# Patient Record
Sex: Female | Born: 1960 | Race: Asian | Hispanic: No | Marital: Married | Smoking: Never smoker
Health system: Southern US, Community
[De-identification: ages and names within clinical notes are randomized; demographics above are authoritative.]

## PROBLEM LIST (undated history)

## (undated) DIAGNOSIS — E785 Hyperlipidemia, unspecified: Secondary | ICD-10-CM

## (undated) DIAGNOSIS — K635 Polyp of colon: Secondary | ICD-10-CM

## (undated) DIAGNOSIS — G43909 Migraine, unspecified, not intractable, without status migrainosus: Secondary | ICD-10-CM

## (undated) DIAGNOSIS — M1990 Unspecified osteoarthritis, unspecified site: Secondary | ICD-10-CM

## (undated) DIAGNOSIS — I10 Essential (primary) hypertension: Secondary | ICD-10-CM

## (undated) DIAGNOSIS — E039 Hypothyroidism, unspecified: Secondary | ICD-10-CM

## (undated) HISTORY — DX: Polyp of colon: K63.5

## (undated) HISTORY — DX: Hyperlipidemia, unspecified: E78.5

## (undated) HISTORY — DX: Unspecified osteoarthritis, unspecified site: M19.90

## (undated) HISTORY — DX: Essential (primary) hypertension: I10

## (undated) HISTORY — PX: WISDOM TOOTH EXTRACTION: SHX21

## (undated) HISTORY — DX: Migraine, unspecified, not intractable, without status migrainosus: G43.909

---

## 1999-11-29 ENCOUNTER — Inpatient Hospital Stay (HOSPITAL_COMMUNITY)
Admission: RE | Admit: 1999-11-29 | Discharge: 1999-12-04 | Payer: Self-pay | Admitting: Physical Medicine & Rehabilitation

## 2000-01-07 ENCOUNTER — Encounter: Admission: RE | Admit: 2000-01-07 | Discharge: 2000-03-14 | Payer: Self-pay | Admitting: Unknown Physician Specialty

## 2005-11-25 ENCOUNTER — Ambulatory Visit: Payer: Self-pay | Admitting: Obstetrics and Gynecology

## 2010-08-02 ENCOUNTER — Ambulatory Visit: Payer: Self-pay | Admitting: Obstetrics and Gynecology

## 2011-08-22 ENCOUNTER — Ambulatory Visit: Payer: Self-pay | Admitting: Obstetrics and Gynecology

## 2012-08-27 ENCOUNTER — Ambulatory Visit: Payer: Self-pay | Admitting: Obstetrics and Gynecology

## 2016-04-21 ENCOUNTER — Encounter: Payer: Self-pay | Admitting: Internal Medicine

## 2017-07-31 ENCOUNTER — Telehealth: Payer: Self-pay

## 2017-07-31 NOTE — Telephone Encounter (Signed)
Gastroenterology Pre-Procedure Review  Request Date:   Requesting Physician: Dr.    PATIENT REVIEW QUESTIONS: The patient responded to the following health history questions as indicated:    1. Are you having any GI issues? No  2. Do you have a personal history of Polyps? No  3. Do you have a family history of Colon Cancer or Polyps? No  4. Diabetes Mellitus? No  5. Joint replacements in the past 12 months? No  6. Major health problems in the past 3 months? No  7. Any artificial heart valves, MVP, or defibrillator? No     MEDICATIONS & ALLERGIES:    Patient reports the following regarding taking any anticoagulation/antiplatelet therapy:   Plavix, Coumadin, Eliquis, Xarelto, Lovenox, Pradaxa, Brilinta, or Effient? No  Aspirin? No   Patient confirms/reports the following medications:  Current Outpatient Medications  Medication Sig Dispense Refill  . ALPRAZolam (XANAX) 0.25 MG tablet Take 0.25 mg by mouth 2 (two) times daily as needed for anxiety.    Marland Kitchen atorvastatin (LIPITOR) 20 MG tablet TAKE 1 TABLET ONCE A DAY ORALLY  0  . hydrochlorothiazide (HYDRODIURIL) 25 MG tablet Take 25 mg by mouth daily.  1  . ibandronate (BONIVA) 150 MG tablet TAKE 1 TABLET ONCE A MONTH ORALLY  0  . levothyroxine (SYNTHROID, LEVOTHROID) 50 MCG tablet TAKE 1 TABLET ONCE A DAY (IN THE MORNING)  0   No current facility-administered medications for this visit.     Patient confirms/reports the following allergies:  No Known Allergies  No orders of the defined types were placed in this encounter.   AUTHORIZATION INFORMATION Primary Insurance: 1D#: Group #:  Secondary Insurance: 1D#: Group #:  SCHEDULE INFORMATION: Date: 09/04/17 Time: Location: [place]

## 2017-08-01 ENCOUNTER — Other Ambulatory Visit: Payer: Self-pay

## 2017-08-01 DIAGNOSIS — Z1211 Encounter for screening for malignant neoplasm of colon: Secondary | ICD-10-CM

## 2017-09-04 ENCOUNTER — Ambulatory Visit
Admission: RE | Admit: 2017-09-04 | Discharge: 2017-09-04 | Disposition: A | Discharge status: Home / Self Care | Payer: BLUE CROSS/BLUE SHIELD | Source: Ambulatory Visit | Attending: Gastroenterology | Admitting: Gastroenterology

## 2017-09-04 ENCOUNTER — Ambulatory Visit: Payer: BLUE CROSS/BLUE SHIELD | Admitting: Registered Nurse

## 2017-09-04 ENCOUNTER — Encounter
Admission: RE | Disposition: A | Discharge status: Home / Self Care | Payer: Self-pay | Source: Ambulatory Visit | Attending: Gastroenterology

## 2017-09-04 ENCOUNTER — Encounter: Payer: Self-pay | Admitting: Anesthesiology

## 2017-09-04 DIAGNOSIS — D122 Benign neoplasm of ascending colon: Secondary | ICD-10-CM

## 2017-09-04 DIAGNOSIS — Z79890 Hormone replacement therapy (postmenopausal): Secondary | ICD-10-CM | POA: Diagnosis not present

## 2017-09-04 DIAGNOSIS — E039 Hypothyroidism, unspecified: Secondary | ICD-10-CM | POA: Insufficient documentation

## 2017-09-04 DIAGNOSIS — D128 Benign neoplasm of rectum: Secondary | ICD-10-CM | POA: Diagnosis not present

## 2017-09-04 DIAGNOSIS — Z1211 Encounter for screening for malignant neoplasm of colon: Secondary | ICD-10-CM | POA: Insufficient documentation

## 2017-09-04 DIAGNOSIS — K621 Rectal polyp: Secondary | ICD-10-CM

## 2017-09-04 DIAGNOSIS — Z79899 Other long term (current) drug therapy: Secondary | ICD-10-CM | POA: Insufficient documentation

## 2017-09-04 HISTORY — PX: COLONOSCOPY WITH PROPOFOL: SHX5780

## 2017-09-04 HISTORY — DX: Hypothyroidism, unspecified: E03.9

## 2017-09-04 SURGERY — COLONOSCOPY WITH PROPOFOL
Anesthesia: General

## 2017-09-04 MED ORDER — PROPOFOL 10 MG/ML IV BOLUS
INTRAVENOUS | Status: DC | PRN
  Administered 2017-09-04: 30 mg via INTRAVENOUS
  Administered 2017-09-04: 70 mg via INTRAVENOUS
  Administered 2017-09-04: 20 mg via INTRAVENOUS

## 2017-09-04 MED ORDER — PROPOFOL 500 MG/50ML IV EMUL
INTRAVENOUS | Status: AC
  Filled 2017-09-04: qty 50

## 2017-09-04 MED ORDER — PROPOFOL 500 MG/50ML IV EMUL
INTRAVENOUS | Status: AC
  Filled 2017-09-04: qty 100

## 2017-09-04 MED ORDER — SODIUM CHLORIDE 0.9 % IV SOLN
INTRAVENOUS | Status: DC
  Administered 2017-09-04: 1000 mL via INTRAVENOUS

## 2017-09-04 MED ORDER — PROPOFOL 500 MG/50ML IV EMUL
INTRAVENOUS | Status: DC | PRN
  Administered 2017-09-04: 140 ug/kg/min via INTRAVENOUS

## 2017-09-04 NOTE — Anesthesia Preprocedure Evaluation (Signed)
Anesthesia Evaluation  Patient identified by MRN, date of birth, ID band Patient awake    Reviewed: Allergy & Precautions, H&P , NPO status , Patient's Chart, lab work & pertinent test results  History of Anesthesia Complications Negative for: history of anesthetic complications  Airway Mallampati: III  TM Distance: >3 FB Neck ROM: full    Dental  (+) Chipped   Pulmonary neg pulmonary ROS, neg shortness of breath,           Cardiovascular Exercise Tolerance: Good hypertension, (-) angina(-) Past MI and (-) DOE      Neuro/Psych PSYCHIATRIC DISORDERS negative neurological ROS     GI/Hepatic negative GI ROS, Neg liver ROS, neg GERD  ,  Endo/Other  Hypothyroidism   Renal/GU negative Renal ROS  negative genitourinary   Musculoskeletal   Abdominal   Peds  Hematology negative hematology ROS (+)   Anesthesia Other Findings Past Medical History: No date: Hypothyroidism  History reviewed. No pertinent surgical history.  BMI    Body Mass Index:  24.45 kg/m      Reproductive/Obstetrics negative OB ROS                             Anesthesia Physical Anesthesia Plan  ASA: III  Anesthesia Plan: General   Post-op Pain Management:    Induction: Intravenous  PONV Risk Score and Plan: Propofol infusion and TIVA  Airway Management Planned: Natural Airway and Nasal Cannula  Additional Equipment:   Intra-op Plan:   Post-operative Plan:   Informed Consent: I have reviewed the patients History and Physical, chart, labs and discussed the procedure including the risks, benefits and alternatives for the proposed anesthesia with the patient or authorized representative who has indicated his/her understanding and acceptance.   Dental Advisory Given  Plan Discussed with: Anesthesiologist, CRNA and Surgeon  Anesthesia Plan Comments: (Patient consented for risks of anesthesia including but not  limited to:  - adverse reactions to medications - risk of intubation if required - damage to teeth, lips or other oral mucosa - sore throat or hoarseness - Damage to heart, brain, lungs or loss of life  Patient voiced understanding.)        Anesthesia Quick Evaluation

## 2017-09-04 NOTE — Anesthesia Postprocedure Evaluation (Signed)
Anesthesia Post Note  Patient: Chelsea Lozano  Procedure(s) Performed: COLONOSCOPY WITH PROPOFOL (N/A )  Patient location during evaluation: Endoscopy Anesthesia Type: General Level of consciousness: awake and alert Pain management: pain level controlled Vital Signs Assessment: post-procedure vital signs reviewed and stable Respiratory status: spontaneous breathing, nonlabored ventilation, respiratory function stable and patient connected to nasal cannula oxygen Cardiovascular status: blood pressure returned to baseline and stable Postop Assessment: no apparent nausea or vomiting Anesthetic complications: no     Last Vitals:  Vitals:   09/04/17 1135 09/04/17 1136  BP:  (!) 86/47  Pulse:  73  Resp:  18  Temp: (!) 36.1 C (!) 36.1 C  SpO2:  100%    Last Pain:  Vitals:   09/04/17 1136  TempSrc: Tympanic  PainSc:                  Precious Haws Piscitello

## 2017-09-04 NOTE — Anesthesia Post-op Follow-up Note (Signed)
Anesthesia QCDR form completed.        

## 2017-09-04 NOTE — H&P (Signed)
     Jonathon Bellows, MD [address], [place], [place], [place], [address] [address], [place], [place], [place], [address] Phone: [phone]  Fax: [phone]  Primary Care Physician:  Casilda Carls, MD   Pre-Procedure History & Physical: HPI:  NOVALEE HORSFALL is a 57 y.o. female is here for an colonoscopy.   Past Medical History:  Diagnosis Date  . Hypothyroidism     History reviewed. No pertinent surgical history.  Prior to Admission medications   Medication Sig Start Date End Date Taking? Authorizing Provider  atorvastatin (LIPITOR) 20 MG tablet TAKE 1 TABLET ONCE A DAY ORALLY 06/12/17  Yes [provider]  hydrochlorothiazide (HYDRODIURIL) 25 MG tablet Take 25 mg by mouth daily. 05/07/17  Yes [provider]  ibandronate (BONIVA) 150 MG tablet TAKE 1 TABLET ONCE A MONTH ORALLY 06/27/17  Yes [provider]  levothyroxine (SYNTHROID, LEVOTHROID) 50 MCG tablet TAKE 1 TABLET ONCE A DAY (IN THE MORNING) 06/12/17  Yes [provider]  ALPRAZolam (XANAX) 0.25 MG tablet Take 0.25 mg by mouth 2 (two) times daily as needed for anxiety.    [provider]    Allergies as of 08/01/2017  . (No Known Allergies)    History reviewed. No pertinent family history.  Social History   Socioeconomic History  . Marital status: Married    Spouse name: Not on file  . Number of children: Not on file  . Years of education: Not on file  . Highest education level: Not on file  Social Needs  . Financial resource strain: Not on file  . Food insecurity - worry: Not on file  . Food insecurity - inability: Not on file  . Transportation needs - medical: Not on file  . Transportation needs - non-medical: Not on file  Occupational History  . Not on file  Tobacco Use  . Smoking status: Not on file  Substance and Sexual Activity  . Alcohol use: Not on file  . Drug use: Not on file  . Sexual activity: Not on file  Other Topics Concern  . Not  on file  Social History Narrative  . Not on file    Review of Systems: See HPI, otherwise negative ROS  Physical Exam: BP 122/84   Pulse 64   Temp (!) 97.2 F (36.2 C) (Tympanic)   Resp 16   Ht 5\' 3"  (1.6 m)   Wt 138 lb (62.6 kg)   SpO2 99%   BMI 24.45 kg/m  General:   Alert,  pleasant and cooperative in NAD Head:  Normocephalic and atraumatic. Neck:  Supple; no masses or thyromegaly. Lungs:  Clear throughout to auscultation, normal respiratory effort.    Heart:  +S1, +S2, Regular rate and rhythm, No edema. Abdomen:  Soft, nontender and nondistended. Normal bowel sounds, without guarding, and without rebound.   Neurologic:  Alert and  oriented x4;  grossly normal neurologically.  Impression/Plan: Sharin Mons is here for an colonoscopy to be performed for Screening colonoscopy average risk   Risks, benefits, limitations, and alternatives regarding  colonoscopy have been reviewed with the patient.  Questions have been answered.  All parties agreeable.   Jonathon Bellows, MD  09/04/2017, 11:08 AM

## 2017-09-04 NOTE — Op Note (Signed)
[facility] Gastroenterology Patient Name: Chelsea Lozano Procedure Date: 09/04/2017 11:09 AM MRN: [id number] Account #: [id number] Date of Birth: September 29, 1960 Admit Type: Outpatient Age: 57 Room: [facility] ENDO ROOM 4 Gender: Female Note Status: Finalized Procedure:            Colonoscopy Indications:          Screening for colorectal malignant neoplasm Providers:            Jonathon Bellows MD, MD Referring MD:         Casilda Carls, MD (Referring MD) Medicines:            Monitored Anesthesia Care Complications:        No immediate complications. Procedure:            Pre-Anesthesia Assessment:                       - Prior to the procedure, a History and Physical was                        performed, and patient medications, allergies and                        sensitivities were reviewed. The patient's tolerance of                        previous anesthesia was reviewed.                       - The risks and benefits of the procedure and the                        sedation options and risks were discussed with the                        patient. All questions were answered and informed                        consent was obtained.                       - ASA Grade Assessment: III - A patient with severe                        systemic disease.                       After obtaining informed consent, the colonoscope was                        passed under direct vision. Throughout the procedure,                        the patient's blood pressure, pulse, and oxygen                        saturations were monitored continuously. The                        Colonoscope was introduced through the anus and  advanced to the the cecum, identified by the                        appendiceal orifice, IC valve and transillumination.                        The colonoscopy was performed with ease. The patient                        tolerated the procedure well. The  quality of the bowel                        preparation was good. Findings:      The perianal and digital rectal examinations were normal.      Two sessile polyps were found in the rectum. The polyps were 3 to 10 mm       in size. These polyps were removed with a cold snare. Resection and       retrieval were complete.      A 5 mm polyp was found in the ascending colon. The polyp was sessile.       The polyp was removed with a cold biopsy forceps. Resection was       complete, but the polyp tissue was not retrieved.      The exam was otherwise without abnormality on direct and retroflexion       views. Impression:           - Two 3 to 10 mm polyps in the rectum, removed with a                        cold snare. Resected and retrieved.                       - One 5 mm polyp in the ascending colon, removed with a                        cold biopsy forceps. Complete resection. Polyp tissue                        not retrieved.                       - The examination was otherwise normal on direct and                        retroflexion views. Recommendation:       - Discharge patient to home (with escort).                       - Resume previous diet.                       - Continue present medications.                       - Repeat colonoscopy in 3 - 5 years for surveillance                        based on pathology results. Procedure Code(s):    --- Professional ---  45385, Colonoscopy, flexible; with removal of tumor(s),                        polyp(s), or other lesion(s) by snare technique                       45380, 59, Colonoscopy, flexible; with biopsy, single                        or multiple Diagnosis Code(s):    --- Professional ---                       Z12.11, Encounter for screening for malignant neoplasm                        of colon                       K62.1, Rectal polyp                       D12.2, Benign neoplasm of ascending colon CPT  copyright 2016 American Medical Association. All rights reserved. The codes documented in this report are preliminary and upon coder review may  be revised to meet current compliance requirements. Jonathon Bellows, MD Jonathon Bellows MD, MD 09/04/2017 11:34:36 AM This report has been signed electronically. Number of Addenda: 0 Note Initiated On: 09/04/2017 11:09 AM Scope Withdrawal Time: 0 hours 13 minutes 27 seconds  Total Procedure Duration: 0 hours 17 minutes 41 seconds       [facility]

## 2017-09-04 NOTE — Transfer of Care (Signed)
Immediate Anesthesia Transfer of Care Note  Patient: Chelsea Lozano  Procedure(s) Performed: COLONOSCOPY WITH PROPOFOL (N/A )  Patient Location: PACU  Anesthesia Type:General  Level of Consciousness: sedated  Airway & Oxygen Therapy: Patient Spontanous Breathing and Patient connected to nasal cannula oxygen  Post-op Assessment: Report given to RN and Post -op Vital signs reviewed and stable  Post vital signs: Reviewed and stable  Last Vitals:  Vitals:   09/04/17 1135 09/04/17 1136  BP:  (!) 86/47  Pulse:  73  Resp:  18  Temp: (!) 36.1 C (!) 36.1 C  SpO2:  100%    Last Pain:  Vitals:   09/04/17 1136  TempSrc: Tympanic  PainSc:          Complications: No apparent anesthesia complications

## 2017-09-05 ENCOUNTER — Encounter: Payer: Self-pay | Admitting: Gastroenterology

## 2017-09-05 LAB — SURGICAL PATHOLOGY

## 2017-09-07 ENCOUNTER — Encounter: Payer: Self-pay | Admitting: Gastroenterology

## 2018-10-19 ENCOUNTER — Encounter: Payer: Self-pay | Admitting: Family Medicine

## 2019-04-10 ENCOUNTER — Ambulatory Visit: Payer: BC Managed Care – PPO | Admitting: Family Medicine

## 2019-04-10 ENCOUNTER — Encounter: Payer: Self-pay | Admitting: Family Medicine

## 2019-04-10 ENCOUNTER — Other Ambulatory Visit: Payer: Self-pay

## 2019-04-10 VITALS — BP 102/60 | HR 71 | Temp 98.3°F | Ht 62.25 in | Wt 128.8 lb

## 2019-04-10 DIAGNOSIS — M79641 Pain in right hand: Secondary | ICD-10-CM

## 2019-04-10 DIAGNOSIS — M8580 Other specified disorders of bone density and structure, unspecified site: Secondary | ICD-10-CM

## 2019-04-10 DIAGNOSIS — Z7689 Persons encountering health services in other specified circumstances: Secondary | ICD-10-CM | POA: Diagnosis not present

## 2019-04-10 DIAGNOSIS — Z5320 Procedure and treatment not carried out because of patient's decision for unspecified reasons: Secondary | ICD-10-CM

## 2019-04-10 DIAGNOSIS — M79642 Pain in left hand: Secondary | ICD-10-CM

## 2019-04-10 DIAGNOSIS — I10 Essential (primary) hypertension: Secondary | ICD-10-CM

## 2019-04-10 NOTE — Progress Notes (Signed)
Subjective:    Patient ID: Chelsea Lozano, female    DOB: Nov 25, 1960, 58 y.o.   MRN: [id number]  HPI This is a 58 yo female who presents today to establish care. Married. No kids.   Last CPE- 11/2018 Mammo-several years ago, declines, does self exam Pap- 2015 Colonoscopy- 2019 Tdap- 06/01/2018 Flu- 03/21/2019 Eye- Dr. Kerin Ransom Dental- Dr. Norman Herrlich Exercise- not regular  Boniva- will be finishing up three year course in December. History of broken bone, 2001 pelvis fracture.   Right thumb pain, base of thumb, now on left. Stiff in am. Wearing brace with little improvement. Has applied ice/heat with temporary relief. Spends a lot of time on computer and phone (texting) for work.   Hypothyroidism- sees nuclear medicine every 3 months for labs. Previously had ? Goiter, iodine treatment. Does not recall having neck US. Has been on same dose of levothyroxine for several years.      Past Medical History:  Diagnosis Date  . Arthritis   . Colon polyp   . Hyperlipemia   . Hypertension   . Hypothyroidism   . Migraines    Past Surgical History:  Procedure Laterality Date  . COLONOSCOPY WITH PROPOFOL N/A 09/04/2017   Procedure: COLONOSCOPY WITH PROPOFOL;  Surgeon: Jonathon Bellows, MD;  Location: [facility] ENDOSCOPY;  Service: Gastroenterology;  Laterality: N/A;  . WISDOM TOOTH EXTRACTION     Family History  Problem Relation Age of Onset  . Arthritis Mother   . Depression Mother   . Heart disease Mother   . Hypertension Mother   . Cancer Father   . Hearing loss Father    Social History   Tobacco Use  . Smoking status: Never Smoker  . Smokeless tobacco: Never Used  Substance Use Topics  . Alcohol use: Yes    Alcohol/week: 2.0 - 3.0 standard drinks    Types: 2 - 3 Glasses of wine per week    Comment: occ. 2-3 glasses a week  . Drug use: Never      Review of Systems Per HPI    Objective:   Physical Exam Vitals signs reviewed.  Constitutional:      General: She is not in acute  distress.    Appearance: Normal appearance. She is normal weight. She is not ill-appearing, toxic-appearing or diaphoretic.  HENT:     Head: Normocephalic and atraumatic.  Eyes:     Conjunctiva/sclera: Conjunctivae normal.  Cardiovascular:     Rate and Rhythm: Normal rate.  Pulmonary:     Effort: Pulmonary effort is normal.  Neurological:     Mental Status: She is alert and oriented to person, place, and time.  Psychiatric:        Mood and Affect: Mood normal.        Behavior: Behavior normal.        Thought Content: Thought content normal.        Judgment: Judgment normal.       BP 102/60 (BP Location: Left Arm, Patient Position: Sitting, Cuff Size: Normal)   Pulse 71   Temp 98.3 F (36.8 C) (Temporal)   Ht 5' 2.25" (1.581 m)   Wt 128 lb 12.8 oz (58.4 kg)   SpO2 97%   BMI 23.37 kg/m  Wt Readings from Last 3 Encounters:  04/10/19 128 lb 12.8 oz (58.4 kg)  09/04/17 138 lb (62.6 kg)       Assessment & Plan:  1. Encounter to establish care - requested records from prior pcp  2. Osteopenia,  unspecified location - DG Bone Density; Future  3. Bilateral hand pain -Discussed continued use of braces and she can as well try over-the-counter topical and oral anti-inflammatories as well as heat/ice.  Given worsening symptoms and bilateral nature will go ahead and refer her to hand surgery - Ambulatory referral to Hand Surgery  4. Mammogram declined - encouraged her to continue monthly self breast exams  5.  Essential hypertension -Blood pressure at goal in the office today.  She is interested in trying to come off some of her medications will decrease her hydrochlorothiazide to 1/2 tablet and have her monitor her blood pressure at home.  Provided parameters for her to DC and instructed her to send me readings in 3 to 4 weeks.  Clarene Reamer, FNP-BC  [facility]  04/10/2019 10:39 AM

## 2019-04-10 NOTE — Progress Notes (Signed)
New Patient Office Visit  Subjective:  Patient ID: Chelsea Lozano, female    DOB: [birth year]  Age: 58 y.o. MRN: [id number]  CC: No chief complaint on file.   HPI Chelsea Lozano is 58 yo female who presents to the office to establish primary care provider. Pt is married with no kids. Pt currently works from home and uses her computer and phone a lot. She reports bilateral thumbs pain, right worse than left. It started about 4-5 weeks ago and progressively got worse.  Pt wears brace on her right hand during the night. She reports pain numbness and tingling in her thumb and pointer finger on the right hand.  Pt tried to apply cold and hot compresses with minimal relief.   Last CPE 11/2018 Tdap Flu 03/2019 Dental  03/2019 EYE 02/2018 Exercise gardening outside couple hours during weekend Mammogram 2015, self exam  dextra scan was done 2017?, hx of broken pelvis due to fall  Colonoscopy 08/2017      Past Medical History:  Diagnosis Date  . Hypothyroidism     Past Surgical History:  Procedure Laterality Date  . COLONOSCOPY WITH PROPOFOL N/A 09/04/2017   Procedure: COLONOSCOPY WITH PROPOFOL;  Surgeon: Jonathon Bellows, MD;  Location: [facility] ENDOSCOPY;  Service: Gastroenterology;  Laterality: N/A;    No family history on file.  Social History   Socioeconomic History  . Marital status: Married    Spouse name: Not on file  . Number of children: Not on file  . Years of education: Not on file  . Highest education level: Not on file  Occupational History  . Not on file  Social Needs  . Financial resource strain: Not on file  . Food insecurity    Worry: Not on file    Inability: Not on file  . Transportation needs    Medical: Not on file    Non-medical: Not on file  Tobacco Use  . Smoking status: Not on file  Substance and Sexual Activity  . Alcohol use: Not on file  . Drug use: Not on file  . Sexual activity: Not on file  Lifestyle  . Physical activity    Days per week: Not  on file    Minutes per session: Not on file  . Stress: Not on file  Relationships  . Social Herbalist on phone: Not on file    Gets together: Not on file    Attends religious service: Not on file    Active member of club or organization: Not on file    Attends meetings of clubs or organizations: Not on file    Relationship status: Not on file  . Intimate partner violence    Fear of current or ex partner: Not on file    Emotionally abused: Not on file    Physically abused: Not on file    Forced sexual activity: Not on file  Other Topics Concern  . Not on file  Social History Narrative  . Not on file    ROS Review of Systems  Constitutional: Negative for activity change, chills and fatigue.  HENT: Negative for congestion and ear pain.   Eyes: Negative for pain.  Respiratory: Negative for cough, chest tightness, shortness of breath and wheezing.   Gastrointestinal: Negative for abdominal distention, abdominal pain, constipation, diarrhea and vomiting.  Genitourinary: Negative.   Musculoskeletal:       Bilateral thumbs pain  Skin: Negative.   Allergic/Immunologic: Negative for environmental allergies.  Neurological: Negative for dizziness, syncope, light-headedness and headaches.  Psychiatric/Behavioral: Negative.    BP 102/60 (BP Location: Left Arm, Patient Position: Sitting, Cuff Size: Normal)   Pulse 71   Temp 98.3 F (36.8 C) (Temporal)   Ht 5' 2.25" (1.581 m)   Wt 58.4 kg   SpO2 97%   BMI 23.37 kg/m  Objective:   Today's Vitals:   Physical Exam Constitutional:      Appearance: Normal appearance. She is normal weight.  HENT:     Head: Normocephalic and atraumatic.  Eyes:     Extraocular Movements: Extraocular movements intact.     Conjunctiva/sclera: Conjunctivae normal.     Pupils: Pupils are equal, round, and reactive to light.  Neck:     Musculoskeletal: Normal range of motion.  Cardiovascular:     Rate and Rhythm: Normal rate and regular  rhythm.     Pulses: Normal pulses.     Heart sounds: Normal heart sounds.  Pulmonary:     Effort: Pulmonary effort is normal. No respiratory distress.     Breath sounds: Normal breath sounds. No wheezing, rhonchi or rales.  Chest:     Chest wall: No tenderness.  Abdominal:     General: Bowel sounds are normal. There is no distension.     Palpations: Abdomen is soft.     Tenderness: There is no abdominal tenderness.  Musculoskeletal:        General: Tenderness present.     Comments: Bilateral thumbs  Skin:    General: Skin is warm and dry.  Neurological:     General: No focal deficit present.     Mental Status: She is alert and oriented to person, place, and time.     Cranial Nerves: No cranial nerve deficit.     Sensory: No sensory deficit.  Psychiatric:        Mood and Affect: Mood normal.        Thought Content: Thought content normal.        Judgment: Judgment normal.     Assessment & Plan:   1. Encounter to establish care Stable condition. Continue with current treatment  2. Osteopenia, unspecified location Follow up after completing coarse of Boniva - DG Bone Density; Future  3. Bilateral hand pain - Ambulatory referral to Hand Surgery  4. Mammogram declined Pt educated about the importance of regular self exam.  Problem List Items Addressed This Visit    None      Outpatient Encounter Medications as of 04/10/2019  Medication Sig  . ALPRAZolam (XANAX) 0.25 MG tablet Take 0.25 mg by mouth 2 (two) times daily as needed for anxiety.  Marland Kitchen atorvastatin (LIPITOR) 20 MG tablet TAKE 1 TABLET ONCE A DAY ORALLY  . hydrochlorothiazide (HYDRODIURIL) 25 MG tablet Take 25 mg by mouth daily.  Marland Kitchen ibandronate (BONIVA) 150 MG tablet TAKE 1 TABLET ONCE A MONTH ORALLY  . levothyroxine (SYNTHROID, LEVOTHROID) 50 MCG tablet TAKE 1 TABLET ONCE A DAY (IN THE MORNING)   No facility-administered encounter medications on file as of 04/10/2019.     Follow-up: No follow-ups on file.    Rica Koyanagi, RN

## 2019-04-10 NOTE — Patient Instructions (Addendum)
Your blood pressure looks great, try taking 1/2 tablet of your HCTZ daily for 1 week and see how your blood pressure is doing. Check a couple of times a week. If consistently less than 140/90, can stop. Send me a picture of your blood pressure log in 3-4 weeks.   For your thumb, try diclofenac (Voltaren) gel over the counter. Ibuprofen 2 tablets at bedtime.   I have put in a referral for hand specialist.

## 2019-07-15 ENCOUNTER — Ambulatory Visit
Admission: RE | Admit: 2019-07-15 | Discharge: 2019-07-15 | Disposition: A | Discharge status: Home / Self Care | Payer: BC Managed Care – PPO | Source: Ambulatory Visit | Attending: Family Medicine | Admitting: Family Medicine

## 2019-07-15 DIAGNOSIS — M8580 Other specified disorders of bone density and structure, unspecified site: Secondary | ICD-10-CM | POA: Diagnosis present

## 2019-08-02 ENCOUNTER — Encounter: Payer: Self-pay | Admitting: Family Medicine

## 2019-10-14 ENCOUNTER — Ambulatory Visit: Payer: BC Managed Care – PPO | Attending: Internal Medicine

## 2019-10-14 DIAGNOSIS — Z23 Encounter for immunization: Secondary | ICD-10-CM

## 2019-10-14 NOTE — Progress Notes (Signed)
   Covid-19 Vaccination Clinic  Name:  NIELA RAIBLE    MRN: [id number] DOB: 11/17/1960  10/14/2019  Ms. Mcneely was observed post Covid-19 immunization for 15 minutes without incident. She was provided with Vaccine Information Sheet and instruction to access the V-Safe system.   Ms. Ladino was instructed to call 911 with any severe reactions post vaccine: Marland Kitchen Difficulty breathing  . Swelling of face and throat  . A fast heartbeat  . A bad rash all over body  . Dizziness and weakness   Immunizations Administered    Name Date Dose VIS Date Route   Pfizer COVID-19 Vaccine 10/14/2019 11:03 AM 0.3 mL 06/21/2019 Intramuscular   Manufacturer: [place]   Lot: [id number]   [place]: [id number]

## 2019-11-06 ENCOUNTER — Other Ambulatory Visit: Payer: Self-pay

## 2019-11-06 ENCOUNTER — Ambulatory Visit: Payer: BC Managed Care – PPO | Attending: Internal Medicine

## 2019-11-06 DIAGNOSIS — Z23 Encounter for immunization: Secondary | ICD-10-CM

## 2019-11-06 NOTE — Progress Notes (Signed)
   Covid-19 Vaccination Clinic  Name:  SMITHIE ACKERMAN    MRN: [id number] DOB: April 25, 1961  11/06/2019  Ms. Lamport was observed post Covid-19 immunization for 15 minutes without incident. She was provided with Vaccine Information Sheet and instruction to access the V-Safe system.   Ms. Beckstrand was instructed to call 911 with any severe reactions post vaccine: Marland Kitchen Difficulty breathing  . Swelling of face and throat  . A fast heartbeat  . A bad rash all over body  . Dizziness and weakness   Immunizations Administered    Name Date Dose VIS Date Route   Pfizer COVID-19 Vaccine 11/06/2019 11:04 AM 0.3 mL 09/04/2018 Intramuscular   Manufacturer: [place]   Lot: [id number]   [place]: [id number]

## 2022-02-08 ENCOUNTER — Other Ambulatory Visit: Payer: Self-pay | Admitting: Internal Medicine

## 2022-02-08 DIAGNOSIS — Z1231 Encounter for screening mammogram for malignant neoplasm of breast: Secondary | ICD-10-CM

## 2022-05-06 ENCOUNTER — Ambulatory Visit
Admission: RE | Admit: 2022-05-06 | Discharge: 2022-05-06 | Disposition: A | Discharge status: Home / Self Care | Payer: BC Managed Care – PPO | Source: Ambulatory Visit | Attending: Internal Medicine | Admitting: Internal Medicine

## 2022-05-06 DIAGNOSIS — Z1231 Encounter for screening mammogram for malignant neoplasm of breast: Secondary | ICD-10-CM

## 2023-04-07 ENCOUNTER — Encounter: Payer: Self-pay | Admitting: *Deleted

## 2023-04-11 ENCOUNTER — Encounter: Payer: Self-pay | Admitting: Internal Medicine

## 2023-04-11 ENCOUNTER — Telehealth: Payer: Self-pay

## 2023-04-11 ENCOUNTER — Other Ambulatory Visit: Payer: Self-pay | Admitting: Internal Medicine

## 2023-04-11 DIAGNOSIS — Z13820 Encounter for screening for osteoporosis: Secondary | ICD-10-CM

## 2023-04-11 NOTE — Telephone Encounter (Signed)
Checking to make sure the labs went in.

## 2023-04-13 ENCOUNTER — Telehealth: Payer: Self-pay

## 2023-04-13 ENCOUNTER — Encounter: Payer: Self-pay | Admitting: *Deleted

## 2023-04-13 ENCOUNTER — Other Ambulatory Visit: Payer: Self-pay | Admitting: *Deleted

## 2023-04-13 ENCOUNTER — Telehealth: Payer: Self-pay | Admitting: *Deleted

## 2023-04-13 DIAGNOSIS — Z860100 Personal history of colon polyps, unspecified: Secondary | ICD-10-CM

## 2023-04-13 NOTE — Telephone Encounter (Signed)
 Patient returned the call to schedule her colonoscopy.

## 2023-04-13 NOTE — Telephone Encounter (Signed)
Message left for patient to return my call.  

## 2023-04-13 NOTE — Telephone Encounter (Signed)
Colonoscopy schedule on 05/18/2023 with Dr Tobi Bastos

## 2023-04-13 NOTE — Telephone Encounter (Signed)
Gastroenterology Pre-Procedure Review  Request Date: 05/18/2023 Requesting Physician: Dr. Tobi Bastos  PATIENT REVIEW QUESTIONS: The patient responded to the following health history questions as indicated:    1. Are you having any GI issues? no 2. Do you have a personal history of Polyps? yes (09/04/2017) 3. Do you have a family history of Colon Cancer or Polyps? no 4. Diabetes Mellitus? no 5. Joint replacements in the past 12 months?no 6. Major health problems in the past 3 months?no 7. Any artificial heart valves, MVP, or defibrillator?no    MEDICATIONS & ALLERGIES:    Patient reports the following regarding taking any anticoagulation/antiplatelet therapy:   Plavix, Coumadin, Eliquis, Xarelto, Lovenox, Pradaxa, Brilinta, or Effient? no Aspirin? no  Patient confirms/reports the following medications:  Current Outpatient Medications  Medication Sig Dispense Refill   ALPRAZolam (XANAX) 0.25 MG tablet Take 0.25 mg by mouth 2 (two) times daily as needed for anxiety.     atorvastatin (LIPITOR) 20 MG tablet TAKE 1 TABLET ONCE A DAY ORALLY  0   hydrochlorothiazide (HYDRODIURIL) 25 MG tablet Take 25 mg by mouth daily.  1   ibandronate (BONIVA) 150 MG tablet TAKE 1 TABLET ONCE A MONTH ORALLY  0   levothyroxine (SYNTHROID, LEVOTHROID) 50 MCG tablet TAKE 1 TABLET ONCE A DAY (IN THE MORNING)  0   No current facility-administered medications for this visit.    Patient confirms/reports the following allergies:  No Known Allergies  No orders of the defined types were placed in this encounter.   AUTHORIZATION INFORMATION Primary Insurance: 1D#: Group #:  Secondary Insurance: 1D#: Group #:  SCHEDULE INFORMATION: Date: 05/18/2023 Time: Location:  [facility]

## 2023-04-13 NOTE — Telephone Encounter (Signed)
Error

## 2023-04-14 MED ORDER — NA SULFATE-K SULFATE-MG SULF 17.5-3.13-1.6 GM/177ML PO SOLN: 1.0000 | Freq: Once | ORAL | 0 refills | Status: AC

## 2023-05-02 ENCOUNTER — Other Ambulatory Visit: Payer: Self-pay

## 2023-05-16 ENCOUNTER — Ambulatory Visit (INDEPENDENT_AMBULATORY_CARE_PROVIDER_SITE_OTHER): Payer: BC Managed Care – PPO

## 2023-05-16 DIAGNOSIS — M8589 Other specified disorders of bone density and structure, multiple sites: Secondary | ICD-10-CM | POA: Diagnosis not present

## 2023-05-16 DIAGNOSIS — Z13820 Encounter for screening for osteoporosis: Secondary | ICD-10-CM

## 2023-05-18 ENCOUNTER — Other Ambulatory Visit: Payer: Self-pay

## 2023-05-18 ENCOUNTER — Encounter
Admission: RE | Disposition: A | Discharge status: Home / Self Care | Payer: Self-pay | Source: Home / Self Care | Attending: Gastroenterology

## 2023-05-18 ENCOUNTER — Ambulatory Visit: Payer: BC Managed Care – PPO | Admitting: Anesthesiology

## 2023-05-18 ENCOUNTER — Ambulatory Visit
Admission: RE | Admit: 2023-05-18 | Discharge: 2023-05-18 | Disposition: A | Discharge status: Home / Self Care | Payer: BC Managed Care – PPO | Attending: Gastroenterology | Admitting: Gastroenterology

## 2023-05-18 DIAGNOSIS — Z09 Encounter for follow-up examination after completed treatment for conditions other than malignant neoplasm: Secondary | ICD-10-CM | POA: Diagnosis not present

## 2023-05-18 DIAGNOSIS — K635 Polyp of colon: Secondary | ICD-10-CM

## 2023-05-18 DIAGNOSIS — E039 Hypothyroidism, unspecified: Secondary | ICD-10-CM | POA: Diagnosis not present

## 2023-05-18 DIAGNOSIS — Z860101 Personal history of adenomatous and serrated colon polyps: Secondary | ICD-10-CM | POA: Diagnosis not present

## 2023-05-18 DIAGNOSIS — D124 Benign neoplasm of descending colon: Secondary | ICD-10-CM | POA: Diagnosis not present

## 2023-05-18 DIAGNOSIS — Z1211 Encounter for screening for malignant neoplasm of colon: Secondary | ICD-10-CM | POA: Insufficient documentation

## 2023-05-18 DIAGNOSIS — Z860100 Personal history of colon polyps, unspecified: Secondary | ICD-10-CM

## 2023-05-18 DIAGNOSIS — D122 Benign neoplasm of ascending colon: Secondary | ICD-10-CM | POA: Diagnosis not present

## 2023-05-18 DIAGNOSIS — I10 Essential (primary) hypertension: Secondary | ICD-10-CM | POA: Diagnosis not present

## 2023-05-18 DIAGNOSIS — D126 Benign neoplasm of colon, unspecified: Secondary | ICD-10-CM

## 2023-05-18 DIAGNOSIS — M1990 Unspecified osteoarthritis, unspecified site: Secondary | ICD-10-CM | POA: Insufficient documentation

## 2023-05-18 DIAGNOSIS — E785 Hyperlipidemia, unspecified: Secondary | ICD-10-CM | POA: Insufficient documentation

## 2023-05-18 HISTORY — PX: COLONOSCOPY WITH PROPOFOL: SHX5780

## 2023-05-18 HISTORY — PX: POLYPECTOMY: SHX5525

## 2023-05-18 SURGERY — COLONOSCOPY WITH PROPOFOL
Anesthesia: General

## 2023-05-18 MED ORDER — SODIUM CHLORIDE 0.9 % IV SOLN: INTRAVENOUS | Status: DC

## 2023-05-18 MED ORDER — PROPOFOL 10 MG/ML IV BOLUS
INTRAVENOUS | Status: DC | PRN
  Administered 2023-05-18: 80 mg via INTRAVENOUS
  Administered 2023-05-18: 100 ug/kg/min via INTRAVENOUS

## 2023-05-18 NOTE — Op Note (Signed)
[facility] Gastroenterology Patient Name: Chelsea Lozano Procedure Date: 05/18/2023 8:22 AM MRN: [id number] Account #: [id number] Date of Birth: 15-Nov-1960 Admit Type: Outpatient Age: 62 Room: [facility] ENDO ROOM 2 Gender: Female Note Status: Finalized Instrument Name: Prentice Docker [id number] Procedure:             Colonoscopy Indications:           Surveillance: Personal history of adenomatous polyps                         on last colonoscopy 5 years ago Providers:             Wyline Mood MD, MD Referring MD:          Sherrie Mustache, MD (Referring MD) Medicines:             Monitored Anesthesia Care Complications:         No immediate complications. Procedure:             Pre-Anesthesia Assessment:                        - Prior to the procedure, a History and Physical was                         performed, and patient medications, allergies and                         sensitivities were reviewed. The patient's tolerance                         of previous anesthesia was reviewed.                        - The risks and benefits of the procedure and the                         sedation options and risks were discussed with the                         patient. All questions were answered and informed                         consent was obtained.                        - ASA Grade Assessment: II - A patient with mild                         systemic disease.                        After obtaining informed consent, the colonoscope was                         passed under direct vision. Throughout the procedure,                         the patient's blood pressure, pulse, and oxygen                         saturations  were monitored continuously. The                         Colonoscope was introduced through the anus and                         advanced to the the cecum, identified by the                         appendiceal orifice. The colonoscopy was performed                          with ease. The patient tolerated the procedure well.                         The quality of the bowel preparation was excellent.                         The ileocecal valve, appendiceal orifice, and rectum                         were photographed. Findings:      The perianal and digital rectal examinations were normal.      Two sessile polyps were found in the descending colon. The polyps were 4       to 5 mm in size. These polyps were removed with a cold snare. Resection       and retrieval were complete.      Three sessile polyps were found in the ascending colon. The polyps were       5 to 6 mm in size. These polyps were removed with a cold snare.       Resection and retrieval were complete.      The exam was otherwise without abnormality on direct and retroflexion       views. Impression:            - Two 4 to 5 mm polyps in the descending colon,                         removed with a cold snare. Resected and retrieved.                        - Three 5 to 6 mm polyps in the ascending colon,                         removed with a cold snare. Resected and retrieved.                        - The examination was otherwise normal on direct and                         retroflexion views. Recommendation:        - Discharge patient to home (with escort).                        - Resume previous diet.                        - Continue present medications.                        -  Await pathology results.                        - Repeat colonoscopy in 3 years for surveillance based                         on pathology results. Procedure Code(s):     --- Professional ---                        [phone], Colonoscopy, flexible; with removal of                         tumor(s), polyp(s), or other lesion(s) by snare                         technique Diagnosis Code(s):     --- Professional ---                        Z86.010, Personal history of colonic polyps                        D12.4,  Benign neoplasm of descending colon                        D12.2, Benign neoplasm of ascending colon CPT copyright 2022 American Medical Association. All rights reserved. The codes documented in this report are preliminary and upon coder review may  be revised to meet current compliance requirements. Wyline Mood, MD Wyline Mood MD, MD 05/18/2023 8:51:28 AM This report has been signed electronically. Number of Addenda: 0 Note Initiated On: 05/18/2023 8:22 AM Scope Withdrawal Time: 0 hours 11 minutes 0 seconds  Total Procedure Duration: 0 hours 14 minutes 19 seconds  Estimated Blood Loss:  Estimated blood loss: none.      [facility]

## 2023-05-18 NOTE — H&P (Signed)
Wyline Mood, MD [address], [address], [place], [place], [address] [address], [place], [place], [address] Phone: [phone]  Fax: [phone]  Primary Care Physician:  Sherrie Mustache, MD   Pre-Procedure History & Physical: HPI:  Chelsea Lozano is a 62 y.o. female is here for an colonoscopy.   Past Medical History:  Diagnosis Date   Arthritis    Colon polyp    Hyperlipemia    Hypertension    Hypothyroidism    Migraines     Past Surgical History:  Procedure Laterality Date   COLONOSCOPY WITH PROPOFOL N/A 09/04/2017   Procedure: COLONOSCOPY WITH PROPOFOL;  Surgeon: Wyline Mood, MD;  Location: [facility] ENDOSCOPY;  Service: Gastroenterology;  Laterality: N/A;   WISDOM TOOTH EXTRACTION      Prior to Admission medications   Medication Sig Start Date End Date Taking? Authorizing Provider  atorvastatin (LIPITOR) 20 MG tablet TAKE 1 TABLET ONCE A DAY ORALLY 06/12/17  Yes [provider]  hydrochlorothiazide (HYDRODIURIL) 25 MG tablet Take 25 mg by mouth daily. 05/07/17  Yes [provider]  levothyroxine (SYNTHROID, LEVOTHROID) 50 MCG tablet TAKE 1 TABLET ONCE A DAY (IN THE MORNING) 06/12/17  Yes [provider]  ALPRAZolam (XANAX) 0.25 MG tablet Take 0.25 mg by mouth 2 (two) times daily as needed for anxiety.    [provider]  ibandronate (BONIVA) 150 MG tablet TAKE 1 TABLET ONCE A MONTH ORALLY 06/27/17   [provider]    Allergies as of 04/14/2023   (No Known Allergies)    Family History  Problem Relation Age of Onset   Arthritis Mother    Depression Mother    Heart disease Mother    Hypertension Mother    Cancer Father    Hearing loss Father     Social History   Socioeconomic History   Marital status: Married    Spouse name: Not on file   Number of children: Not on file   Years of education: Not on file   Highest education level: Not on file  Occupational History   Not on file  Tobacco Use   Smoking  status: Never   Smokeless tobacco: Never  Vaping Use   Vaping status: Never Used  Substance and Sexual Activity   Alcohol use: Yes    Alcohol/week: 2.0 - 3.0 Lozano drinks of alcohol    Types: 2 - 3 Glasses of wine per week    Comment: occ. 2-3 glasses a week   Drug use: Never   Sexual activity: Not on file  Other Topics Concern   Not on file  Social History Narrative   Not on file   Social Determinants of Health   Financial Resource Strain: Not on file  Food Insecurity: Not on file  Transportation Needs: Not on file  Physical Activity: Not on file  Stress: Not on file  Social Connections: Not on file  Intimate Partner Violence: Not on file    Review of Systems: See HPI, otherwise negative ROS  Physical Exam: BP 131/80   Pulse 63   Temp (!) 97.2 F (36.2 C) (Temporal)   Resp 16   Ht 5\' 2"  (1.575 m)   Wt 60.1 kg   SpO2 96%   BMI 24.22 kg/m  General:   Alert,  pleasant and cooperative in NAD Head:  Normocephalic and atraumatic. Neck:  Supple; no masses or thyromegaly. Lungs:  Clear throughout to auscultation, normal respiratory effort.    Heart:  +S1, +S2,  Regular rate and rhythm, No edema. Abdomen:  Soft, nontender and nondistended. Normal bowel sounds, without guarding, and without rebound.   Neurologic:  Alert and  oriented x4;  grossly normal neurologically.  Impression/Plan: Chelsea Lozano is here for an colonoscopy to be performed for surveillance due to prior history of colon polyps   Risks, benefits, limitations, and alternatives regarding  colonoscopy have been reviewed with the patient.  Questions have been answered.  All parties agreeable.   Wyline Mood, MD  05/18/2023, 7:52 AM

## 2023-05-18 NOTE — Anesthesia Postprocedure Evaluation (Signed)
Anesthesia Post Note  Patient: Chelsea Lozano  Procedure(s) Performed: COLONOSCOPY WITH PROPOFOL  Patient location during evaluation: Endoscopy Anesthesia Type: General Level of consciousness: awake and alert Pain management: pain level controlled Vital Signs Assessment: post-procedure vital signs reviewed and stable Respiratory status: spontaneous breathing, nonlabored ventilation, respiratory function stable and patient connected to nasal cannula oxygen Cardiovascular status: blood pressure returned to baseline and stable Postop Assessment: no apparent nausea or vomiting Anesthetic complications: no   No notable events documented.   Last Vitals:  Vitals:   05/18/23 0902 05/18/23 0912  BP: 101/81 115/82  Pulse:    Resp:    Temp:    SpO2:      Last Pain:  Vitals:   05/18/23 0912  TempSrc:   PainSc: 0-No pain                 Corinda Gubler

## 2023-05-18 NOTE — Anesthesia Preprocedure Evaluation (Signed)
Anesthesia Evaluation  Patient identified by MRN, date of birth, ID band Patient awake    Reviewed: Allergy & Precautions, NPO status , Patient's Chart, lab work & pertinent test results  History of Anesthesia Complications Negative for: history of anesthetic complications  Airway Mallampati: II  TM Distance: >3 FB Neck ROM: Full    Dental no notable dental hx. (+) Teeth Intact   Pulmonary neg pulmonary ROS, neg sleep apnea, neg COPD, Patient abstained from smoking.Not current smoker   Pulmonary exam normal breath sounds clear to auscultation       Cardiovascular Exercise Tolerance: Good METShypertension, Pt. on medications (-) CAD and (-) Past MI (-) dysrhythmias  Rhythm:Regular Rate:Normal - Systolic murmurs    Neuro/Psych  Headaches  negative psych ROS   GI/Hepatic ,neg GERD  ,,(+)     (-) substance abuse    Endo/Other  neg diabetesHypothyroidism    Renal/GU negative Renal ROS     Musculoskeletal   Abdominal   Peds  Hematology   Anesthesia Other Findings Past Medical History: No date: Arthritis No date: Colon polyp No date: Hyperlipemia No date: Hypertension No date: Hypothyroidism No date: Migraines  Reproductive/Obstetrics                             Anesthesia Physical Anesthesia Plan  ASA: 2  Anesthesia Plan: General   Post-op Pain Management: Minimal or no pain anticipated   Induction: Intravenous  PONV Risk Score and Plan: 3 and Propofol infusion, TIVA and Ondansetron  Airway Management Planned: Nasal Cannula  Additional Equipment: None  Intra-op Plan:   Post-operative Plan:   Informed Consent: I have reviewed the patients History and Physical, chart, labs and discussed the procedure including the risks, benefits and alternatives for the proposed anesthesia with the patient or authorized representative who has indicated his/her understanding and acceptance.      Dental advisory given  Plan Discussed with: CRNA and Surgeon  Anesthesia Plan Comments: (Discussed risks of anesthesia with patient, including possibility of difficulty with spontaneous ventilation under anesthesia necessitating airway intervention, PONV, and rare risks such as cardiac or respiratory or neurological events, and allergic reactions. Discussed the role of CRNA in patient's perioperative care. Patient understands.)       Anesthesia Quick Evaluation

## 2023-05-18 NOTE — Transfer of Care (Signed)
Immediate Anesthesia Transfer of Care Note  Patient: Chelsea Lozano  Procedure(s) Performed: COLONOSCOPY WITH PROPOFOL  Patient Location: PACU and Endoscopy Unit  Anesthesia Type:General  Level of Consciousness: drowsy and patient cooperative  Airway & Oxygen Therapy: Patient Spontanous Breathing  Post-op Assessment: Report given to RN and Post -op Vital signs reviewed and stable  Post vital signs: Reviewed and stable  Last Vitals:  Vitals Value Taken Time  BP 83/48 05/18/23 0853  Temp 36.4 C 05/18/23 0852  Pulse 59 05/18/23 0855  Resp 17 05/18/23 0852  SpO2 96 % 05/18/23 0855  Vitals shown include unfiled device data.  Last Pain:  Vitals:   05/18/23 0852  TempSrc: Temporal  PainSc: Asleep         Complications: No notable events documented.

## 2023-05-19 ENCOUNTER — Encounter: Payer: Self-pay | Admitting: Gastroenterology

## 2023-05-19 LAB — SURGICAL PATHOLOGY

## 2023-05-22 ENCOUNTER — Encounter: Payer: Self-pay | Admitting: Gastroenterology

## 2024-02-09 ENCOUNTER — Encounter: Payer: Self-pay | Admitting: Internal Medicine

## 2024-05-13 ENCOUNTER — Ambulatory Visit: Payer: Self-pay | Admitting: Internal Medicine

## 2024-05-13 ENCOUNTER — Ambulatory Visit: Admitting: Internal Medicine

## 2024-05-13 ENCOUNTER — Encounter: Payer: Self-pay | Admitting: Internal Medicine

## 2024-05-13 VITALS — BP 98/72 | HR 77 | Ht 62.0 in | Wt 134.8 lb

## 2024-05-13 DIAGNOSIS — Z1231 Encounter for screening mammogram for malignant neoplasm of breast: Secondary | ICD-10-CM | POA: Insufficient documentation

## 2024-05-13 DIAGNOSIS — I10 Essential (primary) hypertension: Secondary | ICD-10-CM | POA: Insufficient documentation

## 2024-05-13 DIAGNOSIS — K219 Gastro-esophageal reflux disease without esophagitis: Secondary | ICD-10-CM | POA: Insufficient documentation

## 2024-05-13 DIAGNOSIS — R1013 Epigastric pain: Secondary | ICD-10-CM | POA: Diagnosis not present

## 2024-05-13 DIAGNOSIS — E782 Mixed hyperlipidemia: Secondary | ICD-10-CM | POA: Diagnosis not present

## 2024-05-13 DIAGNOSIS — B009 Herpesviral infection, unspecified: Secondary | ICD-10-CM | POA: Insufficient documentation

## 2024-05-13 DIAGNOSIS — J0111 Acute recurrent frontal sinusitis: Secondary | ICD-10-CM | POA: Insufficient documentation

## 2024-05-13 DIAGNOSIS — G9331 Postviral fatigue syndrome: Secondary | ICD-10-CM | POA: Insufficient documentation

## 2024-05-13 DIAGNOSIS — R7303 Prediabetes: Secondary | ICD-10-CM | POA: Diagnosis not present

## 2024-05-13 DIAGNOSIS — E039 Hypothyroidism, unspecified: Secondary | ICD-10-CM | POA: Insufficient documentation

## 2024-05-13 LAB — POCT URINALYSIS DIPSTICK
Bilirubin, UA: NEGATIVE
Glucose, UA: NEGATIVE
Ketones, UA: NEGATIVE
Leukocytes, UA: NEGATIVE
Nitrite, UA: NEGATIVE
Protein, UA: NEGATIVE
Spec Grav, UA: 1.02 (ref 1.010–1.025)
Urobilinogen, UA: 0.2 U/dL
pH, UA: 6.5 (ref 5.0–8.0)

## 2024-05-13 MED ORDER — AMOXICILLIN-POT CLAVULANATE 875-125 MG PO TABS: 1.0000 | ORAL_TABLET | Freq: Two times a day (BID) | ORAL | 0 refills | Status: DC

## 2024-05-13 MED ORDER — VALACYCLOVIR HCL 1 G PO TABS: 1000.0000 mg | ORAL_TABLET | Freq: Two times a day (BID) | ORAL | 0 refills | Status: AC

## 2024-05-13 MED ORDER — PANTOPRAZOLE SODIUM 40 MG PO TBEC: 40.0000 mg | DELAYED_RELEASE_TABLET | Freq: Every day | ORAL | 1 refills | Status: DC

## 2024-05-13 NOTE — Progress Notes (Signed)
 New Patient Office Visit  Subjective   Patient ID: Chelsea Lozano, female    DOB: February 04, 1961  Age: 63 y.o. MRN: [id number]  CC:  Chief Complaint  Patient presents with   Establish Care    NPE    HPI [name] presents to establish care Previous Primary Care provider/office:  Dr. Jadali.  she does have additional concerns to discuss today.   Patient is here today to establish care with office as her PCP. She reports she is doing well and has no complaints today. She has PMH that includes: HTN, HLD, prediabetes, hypothyroidism, osteoporosis.  Patient is on Boniva and reports taking taking 50+ multivitamin daily. Recommend 600 mg calcium twice a day in addition to Boniva as most multivitamins do not contain higher concentration of calcium. Patient reports following Boniva protocol as recommended to reduce GERD and GI distress. She endorses epigastric pain today despite taking Boniva as prescribed. Will start patient on Protonix 40 mg daily. Will check UA today to just rule out urinary cause of abdominal discomfort.  Patient reports she has already had covid and flu vaccines for the season. She reports post nasal drip, cough, frontal sinus tenderness, mucous production for the last few weeks. She was told it was a common cold and had been given antibiotics for 5 days which she completed as prescribed. Her symptoms improved and then worsened after completing antibiotics. She reports traveling to multiple locations over the last 2 months as she travels weekly for her job but also endorses traveling to [place] and [place]. Will prescribe Augmentin for 7 days twice a day. Reports taking OTC cold medicine for symptom relief but it has not been helping. Reinforced hand hygiene and getting plenty of rest to recover from frequent traveling and her sinus infection.  She reports cold sore for the first time ever that is on left side of her lower lip. Started 4 days ago. It is crusted over and  denies pain or discomfort. Will order valtrex 1000 mg BID for 5 days.  PHQ-9 score 7; GAD-7 score 0. Reports taking her medications as prescribed. Will adjust Zoloft as needed for worsening symptoms of depression. Denies any suicidal ideation or thoughts of self harm at this time. She is up to date on most preventative screenings. Last colonoscopy 05/2023 and recommended to FU in 3 years. DEXA scan 05/2023. Mammogram past due 04/2022; will order to be completed. Patient reports last pap smear has been over 5 years ago; will have that completed at her next appointment.    Outpatient Encounter Medications as of 05/13/2024  Medication Sig   amoxicillin-clavulanate (AUGMENTIN) 875-125 MG tablet Take 1 tablet by mouth 2 (two) times daily.   atorvastatin (LIPITOR) 40 MG tablet Take 40 mg by mouth daily.   hydrochlorothiazide (HYDRODIURIL) 25 MG tablet Take 25 mg by mouth daily.   ibandronate (BONIVA) 150 MG tablet TAKE 1 TABLET ONCE A MONTH ORALLY   levothyroxine (SYNTHROID) 75 MCG tablet Take 75 mcg by mouth every morning.   pantoprazole (PROTONIX) 40 MG tablet Take 1 tablet (40 mg total) by mouth daily.   sertraline (ZOLOFT) 25 MG tablet Take 25 mg by mouth daily.   traZODone (DESYREL) 50 MG tablet Take 50 mg by mouth at bedtime as needed.   valACYclovir (VALTREX) 1000 MG tablet Take 1 tablet (1,000 mg total) by mouth 2 (two) times daily for 5 days.   [DISCONTINUED] ALPRAZolam (XANAX) 0.25 MG tablet Take 0.25 mg by mouth 2 (two)  times daily as needed for anxiety. (Patient not taking: Reported on 05/13/2024)   [DISCONTINUED] atorvastatin (LIPITOR) 20 MG tablet TAKE 1 TABLET ONCE A DAY ORALLY (Patient not taking: Reported on 05/13/2024)   [DISCONTINUED] levothyroxine (SYNTHROID, LEVOTHROID) 50 MCG tablet TAKE 1 TABLET ONCE A DAY (IN THE MORNING) (Patient not taking: Reported on 05/13/2024)   No facility-administered encounter medications on file as of 05/13/2024.    Past Medical History:  Diagnosis  Date   Arthritis    Colon polyp    Hyperlipemia    Hypertension    Hypothyroidism    Migraines     Past Surgical History:  Procedure Laterality Date   COLONOSCOPY WITH PROPOFOL  N/A 09/04/2017   Procedure: COLONOSCOPY WITH PROPOFOL ;  Surgeon: Therisa Bi, MD;  Location: [facility] ENDOSCOPY;  Service: Gastroenterology;  Laterality: N/A;   COLONOSCOPY WITH PROPOFOL  N/A 05/18/2023   Procedure: COLONOSCOPY WITH PROPOFOL ;  Surgeon: Therisa Bi, MD;  Location: [facility] ENDOSCOPY;  Service: Gastroenterology;  Laterality: N/A;   POLYPECTOMY  05/18/2023   Procedure: POLYPECTOMY;  Surgeon: Therisa Bi, MD;  Location: [facility] ENDOSCOPY;  Service: Gastroenterology;;   WISDOM TOOTH EXTRACTION      Family History  Problem Relation Age of Onset   Arthritis Mother    Depression Mother    Heart disease Mother    Hypertension Mother    Cancer Father    Hearing loss Father     Social History   Socioeconomic History   Marital status: Married    Spouse name: Not on file   Number of children: Not on file   Years of education: Not on file   Highest education level: Not on file  Occupational History   Not on file  Tobacco Use   Smoking status: Never   Smokeless tobacco: Never  Vaping Use   Vaping status: Never Used  Substance and Sexual Activity   Alcohol use: Yes    Alcohol/week: 2.0 - 3.0 standard drinks of alcohol    Types: 2 - 3 Glasses of wine per week    Comment: occ. 2-3 glasses a week   Drug use: Never   Sexual activity: Not on file  Other Topics Concern   Not on file  Social History Narrative   Not on file   Social Drivers of Health   Financial Resource Strain: Not on file  Food Insecurity: Not on file  Transportation Needs: Not on file  Physical Activity: Not on file  Stress: Not on file  Social Connections: Not on file  Intimate Partner Violence: Not on file    Review of Systems  Constitutional: Negative.  Negative for chills, fever and malaise/fatigue.  HENT:  Positive for  congestion. Negative for sore throat.   Eyes: Negative.  Negative for blurred vision and pain.  Respiratory:  Positive for cough and sputum production. Negative for shortness of breath.   Cardiovascular: Negative.  Negative for chest pain, palpitations and leg swelling.  Gastrointestinal:  Positive for abdominal pain, diarrhea (1-2 times a day for the last 3 weeks) and heartburn. Negative for blood in stool, constipation, melena, nausea and vomiting.  Genitourinary: Negative.  Negative for dysuria, flank pain, frequency, hematuria and urgency.  Musculoskeletal: Negative.  Negative for joint pain and myalgias.  Skin:        Cold sore for 4 days on lower lip.  Neurological: Negative.  Negative for dizziness, tingling, sensory change, weakness and headaches.  Endo/Heme/Allergies: Negative.   Psychiatric/Behavioral: Negative.  Negative for depression and suicidal ideas. The  patient is not nervous/anxious.         Objective   BP 98/72   Pulse 77   Ht 5' 2 (1.575 m)   Wt 134 lb 12.8 oz (61.1 kg)   SpO2 97%   BMI 24.66 kg/m   Physical Exam Vitals and nursing note reviewed.  Constitutional:      Appearance: Normal appearance.  HENT:     Head: Normocephalic and atraumatic.     Right Ear: Hearing, tympanic membrane and ear canal normal.     Left Ear: Hearing, tympanic membrane and ear canal normal.     Nose: Congestion present.     Right Sinus: Frontal sinus tenderness present.     Left Sinus: Frontal sinus tenderness present.     Mouth/Throat:     Mouth: Mucous membranes are moist.     Pharynx: Oropharynx is clear.  Eyes:     Conjunctiva/sclera: Conjunctivae normal.     Pupils: Pupils are equal, round, and reactive to light.  Cardiovascular:     Rate and Rhythm: Normal rate and regular rhythm.     Pulses: Normal pulses.     Heart sounds: Normal heart sounds. No murmur heard. Pulmonary:     Effort: Pulmonary effort is normal.     Breath sounds: Normal breath sounds. No  wheezing.  Abdominal:     General: Bowel sounds are normal.     Palpations: Abdomen is soft.     Tenderness: There is abdominal tenderness in the epigastric area. There is no right CVA tenderness or left CVA tenderness.  Musculoskeletal:        General: Normal range of motion.     Cervical back: Normal range of motion.     Right lower leg: No edema.     Left lower leg: No edema.  Skin:    General: Skin is warm and dry.  Neurological:     General: No focal deficit present.     Mental Status: She is alert and oriented to person, place, and time.  Psychiatric:        Mood and Affect: Mood normal.        Behavior: Behavior normal.        Assessment & Plan:  Start Protonix daily. Start valtrex twice day for 5 days. Check routine blood work today and FU with patient on results.Mammogram ordered. Pap smear to be completed at next appointment. Check UA today and culture as needed. Problem List Items Addressed This Visit     Acute recurrent frontal sinusitis   Relevant Medications   amoxicillin-clavulanate (AUGMENTIN) 875-125 MG tablet   valACYclovir (VALTREX) 1000 MG tablet   Gastroesophageal reflux disease without esophagitis   Relevant Medications   pantoprazole (PROTONIX) 40 MG tablet   Essential hypertension, benign - Primary   Relevant Medications   atorvastatin (LIPITOR) 40 MG tablet   Other Relevant Orders   CBC with Diff   CMP14+EGFR   Prediabetes   Relevant Orders   Hemoglobin A1c   Postviral fatigue syndrome   Relevant Orders   TSH   Breast cancer screening by mammogram   Relevant Orders   MM 3D SCREENING MAMMOGRAM BILATERAL BREAST   Mixed hyperlipidemia   Relevant Medications   atorvastatin (LIPITOR) 40 MG tablet   Other Relevant Orders   Lipid Panel w/o Chol/HDL Ratio   HSV-1 (herpes simplex virus 1) infection   Relevant Medications   valACYclovir (VALTREX) 1000 MG tablet   Acquired hypothyroidism   Relevant Medications  levothyroxine (SYNTHROID) 75 MCG  tablet   Other Relevant Orders   TSH   Epigastric pain   Relevant Orders   POCT Urinalysis Dipstick (18997) (Completed)    Return in about 1 week (around 05/20/2024) for FU and need for pap smear.   Total time spent: 45 minutes. This time includes review of previous notes and results and patient face to face interaction during today's visit.    FERNAND FREDY RAMAN, MD  05/13/2024   This document may have been prepared by [facility] Voice Recognition software and as such may include unintentional dictation errors.

## 2024-05-14 LAB — CMP14+EGFR
ALT: 32 IU/L (ref 0–32)
AST: 23 IU/L (ref 0–40)
Albumin: 4.7 g/dL (ref 3.9–4.9)
Alkaline Phosphatase: 80 IU/L (ref 49–135)
BUN/Creatinine Ratio: 14 (ref 12–28)
BUN: 13 mg/dL (ref 8–27)
Bilirubin Total: 0.5 mg/dL (ref 0.0–1.2)
CO2: 27 mmol/L (ref 20–29)
Calcium: 9.8 mg/dL (ref 8.7–10.3)
Chloride: 102 mmol/L (ref 96–106)
Creatinine, Ser: 0.9 mg/dL (ref 0.57–1.00)
Globulin, Total: 2.4 g/dL (ref 1.5–4.5)
Glucose: 88 mg/dL (ref 70–99)
Potassium: 3.8 mmol/L (ref 3.5–5.2)
Sodium: 143 mmol/L (ref 134–144)
Total Protein: 7.1 g/dL (ref 6.0–8.5)
eGFR: 72 mL/min/1.73 (ref >59)

## 2024-05-14 LAB — CBC WITH DIFFERENTIAL/PLATELET
Basophils Absolute: 0 x10E3/uL (ref 0.0–0.2)
Basos: 0 %
EOS (ABSOLUTE): 0.1 x10E3/uL (ref 0.0–0.4)
Eos: 2 %
Hematocrit: 46.6 % (ref 34.0–46.6)
Hemoglobin: 14.8 g/dL (ref 11.1–15.9)
Immature Grans (Abs): 0 x10E3/uL (ref 0.0–0.1)
Immature Granulocytes: 0 %
Lymphocytes Absolute: 1.4 x10E3/uL (ref 0.7–3.1)
Lymphs: 21 %
MCH: 28.6 pg (ref 26.6–33.0)
MCHC: 31.8 g/dL (ref 31.5–35.7)
MCV: 90 fL (ref 79–97)
Monocytes Absolute: 0.5 x10E3/uL (ref 0.1–0.9)
Monocytes: 8 %
Neutrophils Absolute: 4.7 x10E3/uL (ref 1.4–7.0)
Neutrophils: 69 %
Platelets: 293 x10E3/uL (ref 150–450)
RBC: 5.17 x10E6/uL (ref 3.77–5.28)
RDW: 13.5 % (ref 11.7–15.4)
WBC: 6.7 x10E3/uL (ref 3.4–10.8)

## 2024-05-14 LAB — LIPID PANEL W/O CHOL/HDL RATIO
Cholesterol, Total: 208 mg/dL — ABNORMAL HIGH (ref 100–199)
HDL: 65 mg/dL (ref >39)
LDL Chol Calc (NIH): 123 mg/dL — ABNORMAL HIGH (ref 0–99)
Triglycerides: 115 mg/dL (ref 0–149)
VLDL Cholesterol Cal: 20 mg/dL (ref 5–40)

## 2024-05-14 LAB — HEMOGLOBIN A1C
Est. average glucose Bld gHb Est-mCnc: 114 mg/dL
Hgb A1c MFr Bld: 5.6 % (ref 4.8–5.6)

## 2024-05-14 LAB — TSH: TSH: 1.05 u[IU]/mL (ref 0.450–4.500)

## 2024-05-24 ENCOUNTER — Ambulatory Visit (INDEPENDENT_AMBULATORY_CARE_PROVIDER_SITE_OTHER): Admitting: Internal Medicine

## 2024-05-24 ENCOUNTER — Encounter: Payer: Self-pay | Admitting: Internal Medicine

## 2024-05-24 VITALS — BP 110/70 | HR 73 | Ht 62.0 in | Wt 137.2 lb

## 2024-05-24 DIAGNOSIS — Z1151 Encounter for screening for human papillomavirus (HPV): Secondary | ICD-10-CM

## 2024-05-24 DIAGNOSIS — Z1272 Encounter for screening for malignant neoplasm of vagina: Secondary | ICD-10-CM

## 2024-05-24 DIAGNOSIS — M818 Other osteoporosis without current pathological fracture: Secondary | ICD-10-CM | POA: Diagnosis not present

## 2024-05-24 DIAGNOSIS — I10 Essential (primary) hypertension: Secondary | ICD-10-CM

## 2024-05-24 DIAGNOSIS — Z124 Encounter for screening for malignant neoplasm of cervix: Secondary | ICD-10-CM

## 2024-05-24 DIAGNOSIS — R7303 Prediabetes: Secondary | ICD-10-CM | POA: Diagnosis not present

## 2024-05-24 DIAGNOSIS — E782 Mixed hyperlipidemia: Secondary | ICD-10-CM | POA: Diagnosis not present

## 2024-05-24 DIAGNOSIS — Z6825 Body mass index (BMI) 25.0-25.9, adult: Secondary | ICD-10-CM

## 2024-05-24 DIAGNOSIS — E663 Overweight: Secondary | ICD-10-CM

## 2024-05-24 MED ORDER — ROSUVASTATIN CALCIUM 40 MG PO TABS: 40.0000 mg | ORAL_TABLET | Freq: Every day | ORAL | 3 refills | Status: AC

## 2024-05-24 MED ORDER — IBANDRONATE SODIUM 150 MG PO TABS: 150.0000 mg | ORAL_TABLET | ORAL | 3 refills | Status: AC

## 2024-05-24 NOTE — Progress Notes (Signed)
 Established Patient Office Visit  Subjective:  Patient ID: Chelsea Lozano, female    DOB: March 28, 1961  Age: 63 y.o. MRN: [id number]  Chief Complaint  Patient presents with  . Follow-up    PAP    Patient is here today for follow up. She reports sinus congestion has resolved and has completed her antibiotics. She reports having post nasal drip that is causing her to have to frequently clear her throat.  Discussed patients labs today in detail. She is already on Lipitor 40 mg daily. She reports eating a overall healthy diet. Will switch to Crestor 40 mg as patient is already on Atorvastatin 40 mg and her LDL remains elevated.  She reports diarrhea she was experiencing from traveling has since resolved as well.  She was started on Valtrex for HSV1 outbreak and completed that antiviral course without complication.  She is here today for routine PAP smear.    No other concerns at this time.   Past Medical History:  Diagnosis Date  . Arthritis   . Colon polyp   . Hyperlipemia   . Hypertension   . Hypothyroidism   . Migraines     Past Surgical History:  Procedure Laterality Date  . COLONOSCOPY WITH PROPOFOL  N/A 09/04/2017   Procedure: COLONOSCOPY WITH PROPOFOL ;  Surgeon: Therisa Bi, MD;  Location: [facility] ENDOSCOPY;  Service: Gastroenterology;  Laterality: N/A;  . COLONOSCOPY WITH PROPOFOL  N/A 05/18/2023   Procedure: COLONOSCOPY WITH PROPOFOL ;  Surgeon: Therisa Bi, MD;  Location: [facility] ENDOSCOPY;  Service: Gastroenterology;  Laterality: N/A;  . POLYPECTOMY  05/18/2023   Procedure: POLYPECTOMY;  Surgeon: Therisa Bi, MD;  Location: [facility] ENDOSCOPY;  Service: Gastroenterology;;  . SHELLEE TOOTH EXTRACTION      Social History   Socioeconomic History  . Marital status: Married    Spouse name: Not on file  . Number of children: Not on file  . Years of education: Not on file  . Highest education level: Not on file  Occupational History  . Not on file  Tobacco Use  . Smoking  status: Never  . Smokeless tobacco: Never  Vaping Use  . Vaping status: Never Used  Substance and Sexual Activity  . Alcohol use: Yes    Alcohol/week: 2.0 - 3.0 standard drinks of alcohol    Types: 2 - 3 Glasses of wine per week    Comment: occ. 2-3 glasses a week  . Drug use: Never  . Sexual activity: Not on file  Other Topics Concern  . Not on file  Social History Narrative  . Not on file   Social Drivers of Health   Financial Resource Strain: Not on file  Food Insecurity: Not on file  Transportation Needs: Not on file  Physical Activity: Not on file  Stress: Not on file  Social Connections: Not on file  Intimate Partner Violence: Not on file    Family History  Problem Relation Age of Onset  . Arthritis Mother   . Depression Mother   . Heart disease Mother   . Hypertension Mother   . Cancer Father   . Hearing loss Father     No Known Allergies  Outpatient Medications Prior to Visit  Medication Sig  . hydrochlorothiazide (HYDRODIURIL) 25 MG tablet Take 25 mg by mouth daily.  SABRA levothyroxine (SYNTHROID) 75 MCG tablet Take 75 mcg by mouth every morning.  . pantoprazole (PROTONIX) 40 MG tablet Take 1 tablet (40 mg total) by mouth daily.  . sertraline (ZOLOFT) 25 MG tablet  Take 25 mg by mouth daily.  . traZODone (DESYREL) 50 MG tablet Take 50 mg by mouth at bedtime as needed.  . [DISCONTINUED] atorvastatin (LIPITOR) 40 MG tablet Take 40 mg by mouth daily.  . [DISCONTINUED] ibandronate (BONIVA) 150 MG tablet TAKE 1 TABLET ONCE A MONTH ORALLY  . [DISCONTINUED] amoxicillin-clavulanate (AUGMENTIN) 875-125 MG tablet Take 1 tablet by mouth 2 (two) times daily. (Patient not taking: Reported on 05/24/2024)   No facility-administered medications prior to visit.    Review of Systems  Constitutional: Negative.  Negative for chills, fever and malaise/fatigue.  HENT: Negative.  Negative for congestion and sore throat.   Eyes: Negative.  Negative for blurred vision and pain.   Respiratory: Negative.  Negative for cough and shortness of breath.   Cardiovascular: Negative.  Negative for chest pain, palpitations and leg swelling.  Gastrointestinal: Negative.  Negative for abdominal pain, blood in stool, constipation, diarrhea, heartburn, melena, nausea and vomiting.  Genitourinary: Negative.  Negative for dysuria, flank pain, frequency and urgency.  Musculoskeletal: Negative.  Negative for joint pain and myalgias.  Skin: Negative.   Neurological: Negative.  Negative for dizziness, tingling, sensory change, weakness and headaches.  Endo/Heme/Allergies: Negative.   Psychiatric/Behavioral: Negative.  Negative for depression and suicidal ideas. The patient is not nervous/anxious.        Objective:   BP 110/70   Pulse 73   Ht 5' 2 (1.575 m)   Wt 137 lb 3.2 oz (62.2 kg)   SpO2 96%   BMI 25.09 kg/m   Vitals:   05/24/24 1417  BP: 110/70  Pulse: 73  Height: 5' 2 (1.575 m)  Weight: 137 lb 3.2 oz (62.2 kg)  SpO2: 96%  BMI (Calculated): 25.09    Physical Exam Vitals and nursing note reviewed. Exam conducted with a chaperone present.  Constitutional:      Appearance: Normal appearance.  HENT:     Head: Normocephalic and atraumatic.     Nose: Nose normal.     Mouth/Throat:     Mouth: Mucous membranes are moist.     Pharynx: Oropharynx is clear.  Eyes:     Conjunctiva/sclera: Conjunctivae normal.     Pupils: Pupils are equal, round, and reactive to light.  Cardiovascular:     Rate and Rhythm: Normal rate and regular rhythm.     Pulses: Normal pulses.     Heart sounds: Normal heart sounds. No murmur heard. Pulmonary:     Effort: Pulmonary effort is normal.     Breath sounds: Normal breath sounds. No wheezing.  Chest:  Breasts:    Right: Normal. No swelling, bleeding, inverted nipple, mass, nipple discharge, skin change or tenderness.     Left: Normal. No swelling, bleeding, inverted nipple, mass, nipple discharge, skin change or tenderness.   Abdominal:     General: Bowel sounds are normal.     Palpations: Abdomen is soft.     Tenderness: There is no abdominal tenderness. There is no right CVA tenderness or left CVA tenderness.     Hernia: There is no hernia in the left inguinal area or right inguinal area.  Genitourinary:    General: Normal vulva.     Pubic Area: No rash or pubic lice.      Labia:        Right: No rash, tenderness, lesion or injury.        Left: No rash, tenderness, lesion or injury.      Urethra: No prolapse.     Vagina: Normal.  No signs of injury and foreign body. No vaginal discharge, erythema, tenderness, bleeding, lesions or prolapsed vaginal walls.     Cervix: Normal.     Uterus: Normal.      Adnexa: Right adnexa normal and left adnexa normal.       Right: No mass, tenderness or fullness.         Left: No mass, tenderness or fullness.    Musculoskeletal:        General: Normal range of motion.     Cervical back: Normal range of motion.     Right lower leg: No edema.     Left lower leg: No edema.  Lymphadenopathy:     Upper Body:     Right upper body: No supraclavicular, axillary or pectoral adenopathy.     Left upper body: No supraclavicular, axillary or pectoral adenopathy.     Lower Body: No right inguinal adenopathy. No left inguinal adenopathy.  Skin:    General: Skin is warm and dry.  Neurological:     General: No focal deficit present.     Mental Status: She is alert and oriented to person, place, and time.  Psychiatric:        Mood and Affect: Mood normal.        Behavior: Behavior normal.      No results found for any visits on 05/24/24.  Recent Results (from the past 2160 hours)  POCT Urinalysis Dipstick (18997)     Status: None   Collection Time: 05/13/24 10:28 AM  Result Value Ref Range   Color, UA Yellow    Clarity, UA Clear    Glucose, UA Negative Negative   Bilirubin, UA Negative    Ketones, UA Negative    Spec Grav, UA 1.020 1.010 - 1.025   Blood, UA Trace     pH, UA 6.5 5.0 - 8.0   Protein, UA Negative Negative   Urobilinogen, UA 0.2 0.2 or 1.0 E.U./dL   Nitrite, UA Negative    Leukocytes, UA Negative Negative   Appearance Clear    Odor Yes   CBC with Diff     Status: None   Collection Time: 05/13/24 10:36 AM  Result Value Ref Range   WBC 6.7 3.4 - 10.8 x10E3/uL   RBC 5.17 3.77 - 5.28 x10E6/uL   Hemoglobin 14.8 11.1 - 15.9 g/dL   Hematocrit 53.3 65.9 - 46.6 %   MCV 90 79 - 97 fL   MCH 28.6 26.6 - 33.0 pg   MCHC 31.8 31.5 - 35.7 g/dL   RDW 86.4 88.2 - 84.5 %   Platelets 293 150 - 450 x10E3/uL   Neutrophils 69 Not Estab. %   Lymphs 21 Not Estab. %   Monocytes 8 Not Estab. %   Eos 2 Not Estab. %   Basos 0 Not Estab. %   Neutrophils Absolute 4.7 1.4 - 7.0 x10E3/uL   Lymphocytes Absolute 1.4 0.7 - 3.1 x10E3/uL   Monocytes Absolute 0.5 0.1 - 0.9 x10E3/uL   EOS (ABSOLUTE) 0.1 0.0 - 0.4 x10E3/uL   Basophils Absolute 0.0 0.0 - 0.2 x10E3/uL   Immature Granulocytes 0 Not Estab. %   Immature Grans (Abs) 0.0 0.0 - 0.1 x10E3/uL  CMP14+EGFR     Status: None   Collection Time: 05/13/24 10:36 AM  Result Value Ref Range   Glucose 88 70 - 99 mg/dL   BUN 13 8 - 27 mg/dL   Creatinine, Ser 9.09 0.57 - 1.00 mg/dL   eGFR 72 >40  mL/min/1.73   BUN/Creatinine Ratio 14 12 - 28   Sodium 143 134 - 144 mmol/L   Potassium 3.8 3.5 - 5.2 mmol/L   Chloride 102 96 - 106 mmol/L   CO2 27 20 - 29 mmol/L   Calcium 9.8 8.7 - 10.3 mg/dL   Total Protein 7.1 6.0 - 8.5 g/dL   Albumin 4.7 3.9 - 4.9 g/dL   Globulin, Total 2.4 1.5 - 4.5 g/dL   Bilirubin Total 0.5 0.0 - 1.2 mg/dL   Alkaline Phosphatase 80 49 - 135 IU/L   AST 23 0 - 40 IU/L   ALT 32 0 - 32 IU/L  Lipid Panel w/o Chol/HDL Ratio     Status: Abnormal   Collection Time: 05/13/24 10:36 AM  Result Value Ref Range   Cholesterol, Total 208 (H) 100 - 199 mg/dL   Triglycerides 884 0 - 149 mg/dL   HDL 65 >60 mg/dL   VLDL Cholesterol Cal 20 5 - 40 mg/dL   LDL Chol Calc (NIH) 876 (H) 0 - 99 mg/dL  Hemoglobin  J8r     Status: None   Collection Time: 05/13/24 10:36 AM  Result Value Ref Range   Hgb A1c MFr Bld 5.6 4.8 - 5.6 %    Comment:          Prediabetes: 5.7 - 6.4          Diabetes: >6.4          Glycemic control for adults with diabetes: <7.0    Est. average glucose Bld gHb Est-mCnc 114 mg/dL  TSH     Status: None   Collection Time: 05/13/24 10:36 AM  Result Value Ref Range   TSH 1.050 0.450 - 4.500 uIU/mL      Assessment & Plan:  Start Crestor 40 mg. Stop Lipitor 40 mg. Continue other medications as prescribed. Refill sent. Pap smear performed today. Reinforced healthy diet and exercise as tolerated. Problem List Items Addressed This Visit     Essential hypertension, benign   Relevant Medications   rosuvastatin (CRESTOR) 40 MG tablet   Prediabetes   Mixed hyperlipidemia - Primary   Relevant Medications   rosuvastatin (CRESTOR) 40 MG tablet   Overweight with body mass index (BMI) of 25 to 25.9 in adult   Other osteoporosis without current pathological fracture   Relevant Medications   ibandronate (BONIVA) 150 MG tablet    Return in about 3 months (around 08/24/2024).   Total time spent: 30 minutes. This time includes review of previous notes and results and patient face to face interaction during today's visit.    FERNAND FREDY RAMAN, MD  05/24/2024   This document may have been prepared by [facility] Voice Recognition software and as such may include unintentional dictation errors.

## 2024-05-27 LAB — IGP, APTIMA HPV
HPV Aptima: NEGATIVE
PAP Smear Comment: 0

## 2024-05-28 ENCOUNTER — Ambulatory Visit: Payer: Self-pay | Admitting: Internal Medicine

## 2024-05-30 ENCOUNTER — Other Ambulatory Visit: Payer: Self-pay | Admitting: Internal Medicine

## 2024-05-30 NOTE — Progress Notes (Signed)
 Patient notified

## 2024-06-04 ENCOUNTER — Other Ambulatory Visit: Payer: Self-pay | Admitting: Internal Medicine

## 2024-06-04 DIAGNOSIS — K219 Gastro-esophageal reflux disease without esophagitis: Secondary | ICD-10-CM

## 2024-06-29 ENCOUNTER — Other Ambulatory Visit: Payer: Self-pay | Admitting: Internal Medicine

## 2024-07-09 ENCOUNTER — Ambulatory Visit
Admission: RE | Admit: 2024-07-09 | Discharge: 2024-07-09 | Disposition: A | Discharge status: Home / Self Care | Source: Ambulatory Visit | Attending: Internal Medicine | Admitting: Internal Medicine

## 2024-07-09 DIAGNOSIS — Z1231 Encounter for screening mammogram for malignant neoplasm of breast: Secondary | ICD-10-CM | POA: Diagnosis present

## 2024-07-11 ENCOUNTER — Other Ambulatory Visit: Payer: Self-pay | Admitting: Medical Genetics

## 2024-07-15 ENCOUNTER — Other Ambulatory Visit
Admission: RE | Admit: 2024-07-15 | Discharge: 2024-07-15 | Disposition: A | Discharge status: Home / Self Care | Payer: Self-pay | Source: Ambulatory Visit | Attending: Medical Genetics | Admitting: Medical Genetics

## 2024-07-15 ENCOUNTER — Other Ambulatory Visit

## 2024-07-23 LAB — GENECONNECT MOLECULAR SCREEN: Genetic Analysis Overall Interpretation: NEGATIVE

## 2024-08-26 ENCOUNTER — Ambulatory Visit: Admitting: Internal Medicine
